# Patient Record
Sex: Female | Born: 1995 | Race: White | Marital: Single | Smoking: Never smoker
Health system: Southern US, Community
[De-identification: ages and names within clinical notes are randomized; demographics above are authoritative.]

---
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
## 2018-05-16 ENCOUNTER — Telehealth: Payer: Self-pay | Admitting: Family Medicine

## 2018-05-16 NOTE — Telephone Encounter (Signed)
Called and could not leave vm for patient. Calling to verify PCP. Patient has not been seen since 2013

## 2019-06-25 ENCOUNTER — Telehealth: Payer: Self-pay | Admitting: General Practice

## 2019-06-25 NOTE — Telephone Encounter (Signed)
Removed Dr. Aron as patient's PCP due to her leaving this practice and patient has not been seen in 7 years.
